# Patient Record
Sex: Male | Born: 1980 | Race: White | Hispanic: Yes | Marital: Married | Smoking: Current some day smoker
Health system: Southern US, Community
[De-identification: ages and names within clinical notes are randomized; demographics above are authoritative.]

---

## 2020-12-31 ENCOUNTER — Encounter (HOSPITAL_COMMUNITY): Payer: Self-pay

## 2020-12-31 ENCOUNTER — Emergency Department (HOSPITAL_COMMUNITY): Payer: No Typology Code available for payment source

## 2020-12-31 ENCOUNTER — Other Ambulatory Visit: Payer: Self-pay

## 2020-12-31 ENCOUNTER — Emergency Department (HOSPITAL_COMMUNITY)
Admission: EM | Admit: 2020-12-31 | Discharge: 2020-12-31 | Disposition: A | Discharge status: Home / Self Care | Payer: No Typology Code available for payment source | Attending: Emergency Medicine | Admitting: Emergency Medicine

## 2020-12-31 DIAGNOSIS — F17210 Nicotine dependence, cigarettes, uncomplicated: Secondary | ICD-10-CM | POA: Insufficient documentation

## 2020-12-31 DIAGNOSIS — S2020XA Contusion of thorax, unspecified, initial encounter: Secondary | ICD-10-CM | POA: Diagnosis not present

## 2020-12-31 DIAGNOSIS — S0081XA Abrasion of other part of head, initial encounter: Secondary | ICD-10-CM | POA: Insufficient documentation

## 2020-12-31 DIAGNOSIS — R519 Headache, unspecified: Secondary | ICD-10-CM | POA: Diagnosis not present

## 2020-12-31 DIAGNOSIS — Y92410 Unspecified street and highway as the place of occurrence of the external cause: Secondary | ICD-10-CM | POA: Diagnosis not present

## 2020-12-31 DIAGNOSIS — S40812A Abrasion of left upper arm, initial encounter: Secondary | ICD-10-CM | POA: Diagnosis not present

## 2020-12-31 DIAGNOSIS — S2223XA Sternal manubrial dissociation, initial encounter for closed fracture: Secondary | ICD-10-CM | POA: Diagnosis not present

## 2020-12-31 DIAGNOSIS — S299XXA Unspecified injury of thorax, initial encounter: Secondary | ICD-10-CM | POA: Diagnosis present

## 2020-12-31 DIAGNOSIS — S298XXA Other specified injuries of thorax, initial encounter: Secondary | ICD-10-CM

## 2020-12-31 LAB — COMPREHENSIVE METABOLIC PANEL
ALT: 34 U/L (ref 0–44)
AST: 39 U/L (ref 15–41)
Albumin: 4.4 g/dL (ref 3.5–5.0)
Alkaline Phosphatase: 78 U/L (ref 38–126)
Anion gap: 9 (ref 5–15)
BUN: 19 mg/dL (ref 6–20)
CO2: 28 mmol/L (ref 22–32)
Calcium: 9.3 mg/dL (ref 8.9–10.3)
Chloride: 103 mmol/L (ref 98–111)
Creatinine, Ser: 0.87 mg/dL (ref 0.61–1.24)
GFR, Estimated: >60 mL/min (ref >60)
Glucose, Bld: 166 mg/dL — ABNORMAL HIGH (ref 70–99)
Potassium: 3.7 mmol/L (ref 3.5–5.1)
Sodium: 140 mmol/L (ref 135–145)
Total Bilirubin: 0.7 mg/dL (ref 0.3–1.2)
Total Protein: 7.6 g/dL (ref 6.5–8.1)

## 2020-12-31 LAB — I-STAT CHEM 8, ED
BUN: 18 mg/dL (ref 6–20)
Calcium, Ion: 1.21 mmol/L (ref 1.15–1.40)
Chloride: 101 mmol/L (ref 98–111)
Creatinine, Ser: 0.9 mg/dL (ref 0.61–1.24)
Glucose, Bld: 166 mg/dL — ABNORMAL HIGH (ref 70–99)
HCT: 50 % (ref 39.0–52.0)
Hemoglobin: 17 g/dL (ref 13.0–17.0)
Potassium: 3.9 mmol/L (ref 3.5–5.1)
Sodium: 143 mmol/L (ref 135–145)
TCO2: 30 mmol/L (ref 22–32)

## 2020-12-31 LAB — CBC
HCT: 48.2 % (ref 39.0–52.0)
Hemoglobin: 16.7 g/dL (ref 13.0–17.0)
MCH: 31.7 pg (ref 26.0–34.0)
MCHC: 34.6 g/dL (ref 30.0–36.0)
MCV: 91.5 fL (ref 80.0–100.0)
Platelets: 228 10*3/uL (ref 150–400)
RBC: 5.27 MIL/uL (ref 4.22–5.81)
RDW: 12.5 % (ref 11.5–15.5)
WBC: 12.6 10*3/uL — ABNORMAL HIGH (ref 4.0–10.5)
nRBC: 0 % (ref 0.0–0.2)

## 2020-12-31 LAB — LACTIC ACID, PLASMA: Lactic Acid, Venous: 1.3 mmol/L (ref 0.5–1.9)

## 2020-12-31 LAB — ETHANOL: Alcohol, Ethyl (B): <10 mg/dL (ref <10)

## 2020-12-31 LAB — SAMPLE TO BLOOD BANK

## 2020-12-31 LAB — PROTIME-INR
INR: 1 (ref 0.8–1.2)
Prothrombin Time: 13.5 seconds (ref 11.4–15.2)

## 2020-12-31 MED ORDER — IOHEXOL 300 MG/ML  SOLN
100.0000 mL | Freq: Once | INTRAMUSCULAR | Status: AC | PRN
  Administered 2020-12-31: 100 mL via INTRAVENOUS

## 2020-12-31 MED ORDER — OXYCODONE-ACETAMINOPHEN 5-325 MG PO TABS
2.0000 | ORAL_TABLET | Freq: Once | ORAL | Status: AC
  Administered 2020-12-31: 2 via ORAL
  Filled 2020-12-31: qty 2

## 2020-12-31 MED ORDER — OXYCODONE-ACETAMINOPHEN 5-325 MG PO TABS: 1.0000 | ORAL_TABLET | Freq: Four times a day (QID) | ORAL | 0 refills | Status: AC | PRN
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
## 2020-12-31 NOTE — ED Notes (Signed)
Pt alert and oriented x 4. Pt is able to ambulate with steady gait.
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
## 2020-12-31 NOTE — ED Notes (Signed)
C-collar removed- Dr Charm Barges at bedside. Interpretor being used

## 2020-12-31 NOTE — ED Notes (Signed)
Per EMS fentanyl given to patient PTA.
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
## 2021-01-01 ENCOUNTER — Encounter (HOSPITAL_COMMUNITY): Payer: Self-pay | Admitting: Emergency Medicine
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
